# Patient Record
Sex: Female | Born: 1960 | Race: White | Hispanic: No | Marital: Married
Health system: Southern US, Community
[De-identification: ages and names within clinical notes are randomized; demographics above are authoritative.]

## PROBLEM LIST (undated history)

## (undated) HISTORY — PX: BREAST EXCISIONAL BIOPSY: SUR124

---

## 1998-08-08 ENCOUNTER — Ambulatory Visit (HOSPITAL_COMMUNITY): Admission: RE | Admit: 1998-08-08 | Discharge: 1998-08-08 | Payer: Self-pay | Admitting: Internal Medicine

## 1998-08-08 ENCOUNTER — Encounter: Payer: Self-pay | Admitting: Internal Medicine

## 1999-07-06 ENCOUNTER — Other Ambulatory Visit: Admission: RE | Admit: 1999-07-06 | Discharge: 1999-07-06 | Payer: Self-pay | Admitting: Obstetrics & Gynecology

## 1999-09-11 ENCOUNTER — Encounter: Payer: Self-pay | Admitting: Obstetrics & Gynecology

## 1999-09-11 ENCOUNTER — Encounter: Admission: RE | Admit: 1999-09-11 | Discharge: 1999-09-11 | Payer: Self-pay | Admitting: Obstetrics & Gynecology

## 2000-02-26 ENCOUNTER — Encounter: Admission: RE | Admit: 2000-02-26 | Discharge: 2000-02-26 | Payer: Self-pay | Admitting: Obstetrics & Gynecology

## 2000-02-26 ENCOUNTER — Encounter: Payer: Self-pay | Admitting: Obstetrics & Gynecology

## 2000-09-02 ENCOUNTER — Other Ambulatory Visit: Admission: RE | Admit: 2000-09-02 | Discharge: 2000-09-02 | Payer: Self-pay | Admitting: Obstetrics & Gynecology

## 2000-10-27 ENCOUNTER — Encounter (INDEPENDENT_AMBULATORY_CARE_PROVIDER_SITE_OTHER): Payer: Self-pay | Admitting: Specialist

## 2000-10-27 ENCOUNTER — Observation Stay (HOSPITAL_COMMUNITY): Admission: RE | Admit: 2000-10-27 | Discharge: 2000-10-28 | Payer: Self-pay | Admitting: Obstetrics & Gynecology

## 2001-07-03 ENCOUNTER — Encounter: Admission: RE | Admit: 2001-07-03 | Discharge: 2001-07-03 | Payer: Self-pay | Admitting: Diagnostic Radiology

## 2001-11-27 ENCOUNTER — Other Ambulatory Visit: Admission: RE | Admit: 2001-11-27 | Discharge: 2001-11-27 | Payer: Self-pay | Admitting: Obstetrics & Gynecology

## 2002-01-01 ENCOUNTER — Encounter: Admission: RE | Admit: 2002-01-01 | Discharge: 2002-01-01 | Payer: Self-pay | Admitting: Obstetrics & Gynecology

## 2002-01-01 ENCOUNTER — Encounter: Payer: Self-pay | Admitting: Obstetrics & Gynecology

## 2003-01-04 ENCOUNTER — Other Ambulatory Visit: Admission: RE | Admit: 2003-01-04 | Discharge: 2003-01-04 | Payer: Self-pay | Admitting: Obstetrics & Gynecology

## 2003-03-04 ENCOUNTER — Encounter: Admission: RE | Admit: 2003-03-04 | Discharge: 2003-03-04 | Payer: Self-pay | Admitting: Urology

## 2003-03-04 ENCOUNTER — Encounter: Payer: Self-pay | Admitting: Urology

## 2004-08-30 ENCOUNTER — Other Ambulatory Visit: Admission: RE | Admit: 2004-08-30 | Discharge: 2004-08-30 | Payer: Self-pay | Admitting: Obstetrics & Gynecology

## 2005-03-11 ENCOUNTER — Encounter: Admission: RE | Admit: 2005-03-11 | Discharge: 2005-03-11 | Payer: Self-pay | Admitting: Obstetrics & Gynecology

## 2005-10-23 ENCOUNTER — Other Ambulatory Visit: Admission: RE | Admit: 2005-10-23 | Discharge: 2005-10-23 | Payer: Self-pay | Admitting: Obstetrics & Gynecology

## 2006-06-02 ENCOUNTER — Encounter: Admission: RE | Admit: 2006-06-02 | Discharge: 2006-06-02 | Payer: Self-pay | Admitting: Obstetrics & Gynecology

## 2006-11-04 ENCOUNTER — Encounter: Admission: RE | Admit: 2006-11-04 | Discharge: 2006-11-04 | Payer: Self-pay | Admitting: Occupational Medicine

## 2007-07-03 ENCOUNTER — Encounter: Admission: RE | Admit: 2007-07-03 | Discharge: 2007-07-03 | Payer: Self-pay | Admitting: Obstetrics & Gynecology

## 2008-03-18 ENCOUNTER — Encounter: Admission: RE | Admit: 2008-03-18 | Discharge: 2008-03-18 | Payer: Self-pay | Admitting: Diagnostic Radiology

## 2008-03-21 ENCOUNTER — Encounter: Admission: RE | Admit: 2008-03-21 | Discharge: 2008-03-21 | Payer: Self-pay | Admitting: Sports Medicine

## 2008-03-28 ENCOUNTER — Encounter: Admission: RE | Admit: 2008-03-28 | Discharge: 2008-03-28 | Payer: Self-pay | Admitting: Sports Medicine

## 2008-07-04 ENCOUNTER — Encounter: Admission: RE | Admit: 2008-07-04 | Discharge: 2008-07-04 | Payer: Self-pay | Admitting: Obstetrics & Gynecology

## 2009-07-12 ENCOUNTER — Encounter: Admission: RE | Admit: 2009-07-12 | Discharge: 2009-07-12 | Payer: Self-pay | Admitting: Obstetrics & Gynecology

## 2009-07-29 IMAGING — CR DG KNEE 3 VIEWS*R*
3 series · 3 of 3 positions shown · non-contrast
Comparison: Ultrasound 03/18/2008

CLINICAL DATA: Pain and swelling posteriorly.  Question Baker's
cyst.

RIGHT KNEE - 3 VIEW

[w knee ap right]
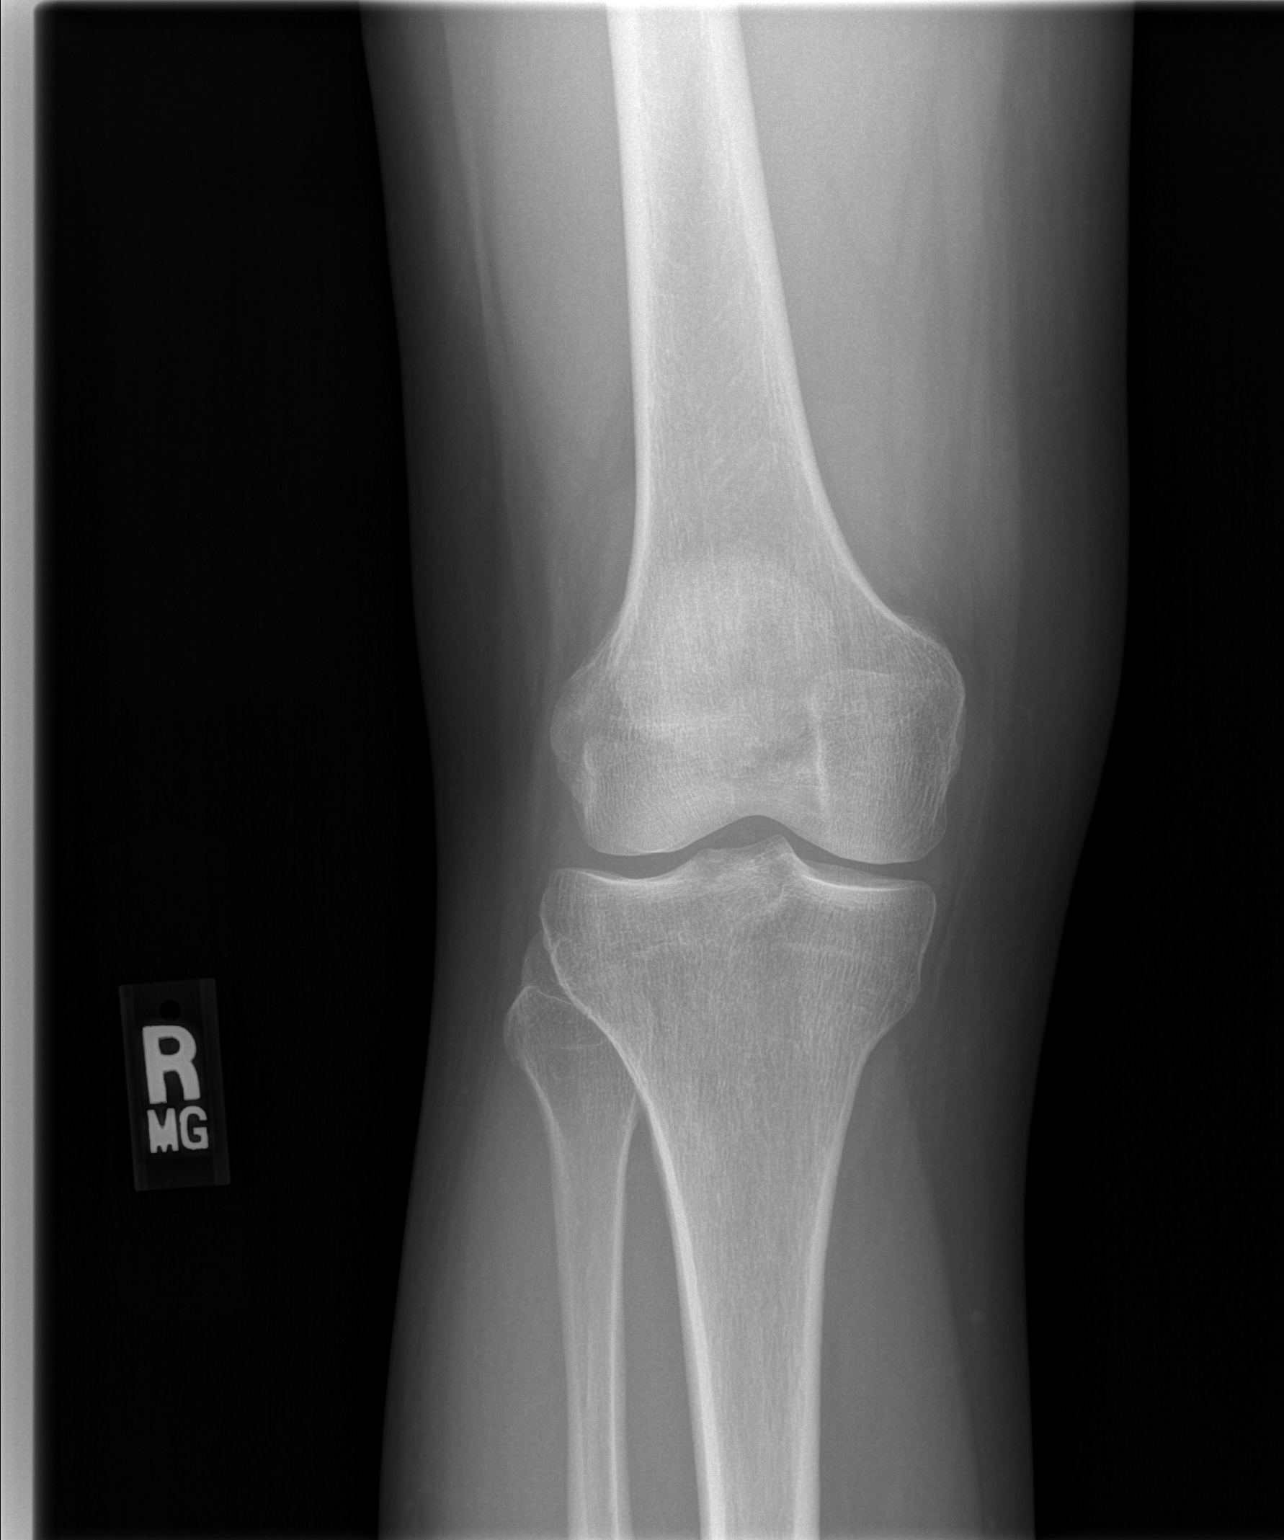

[w knee lat. right]
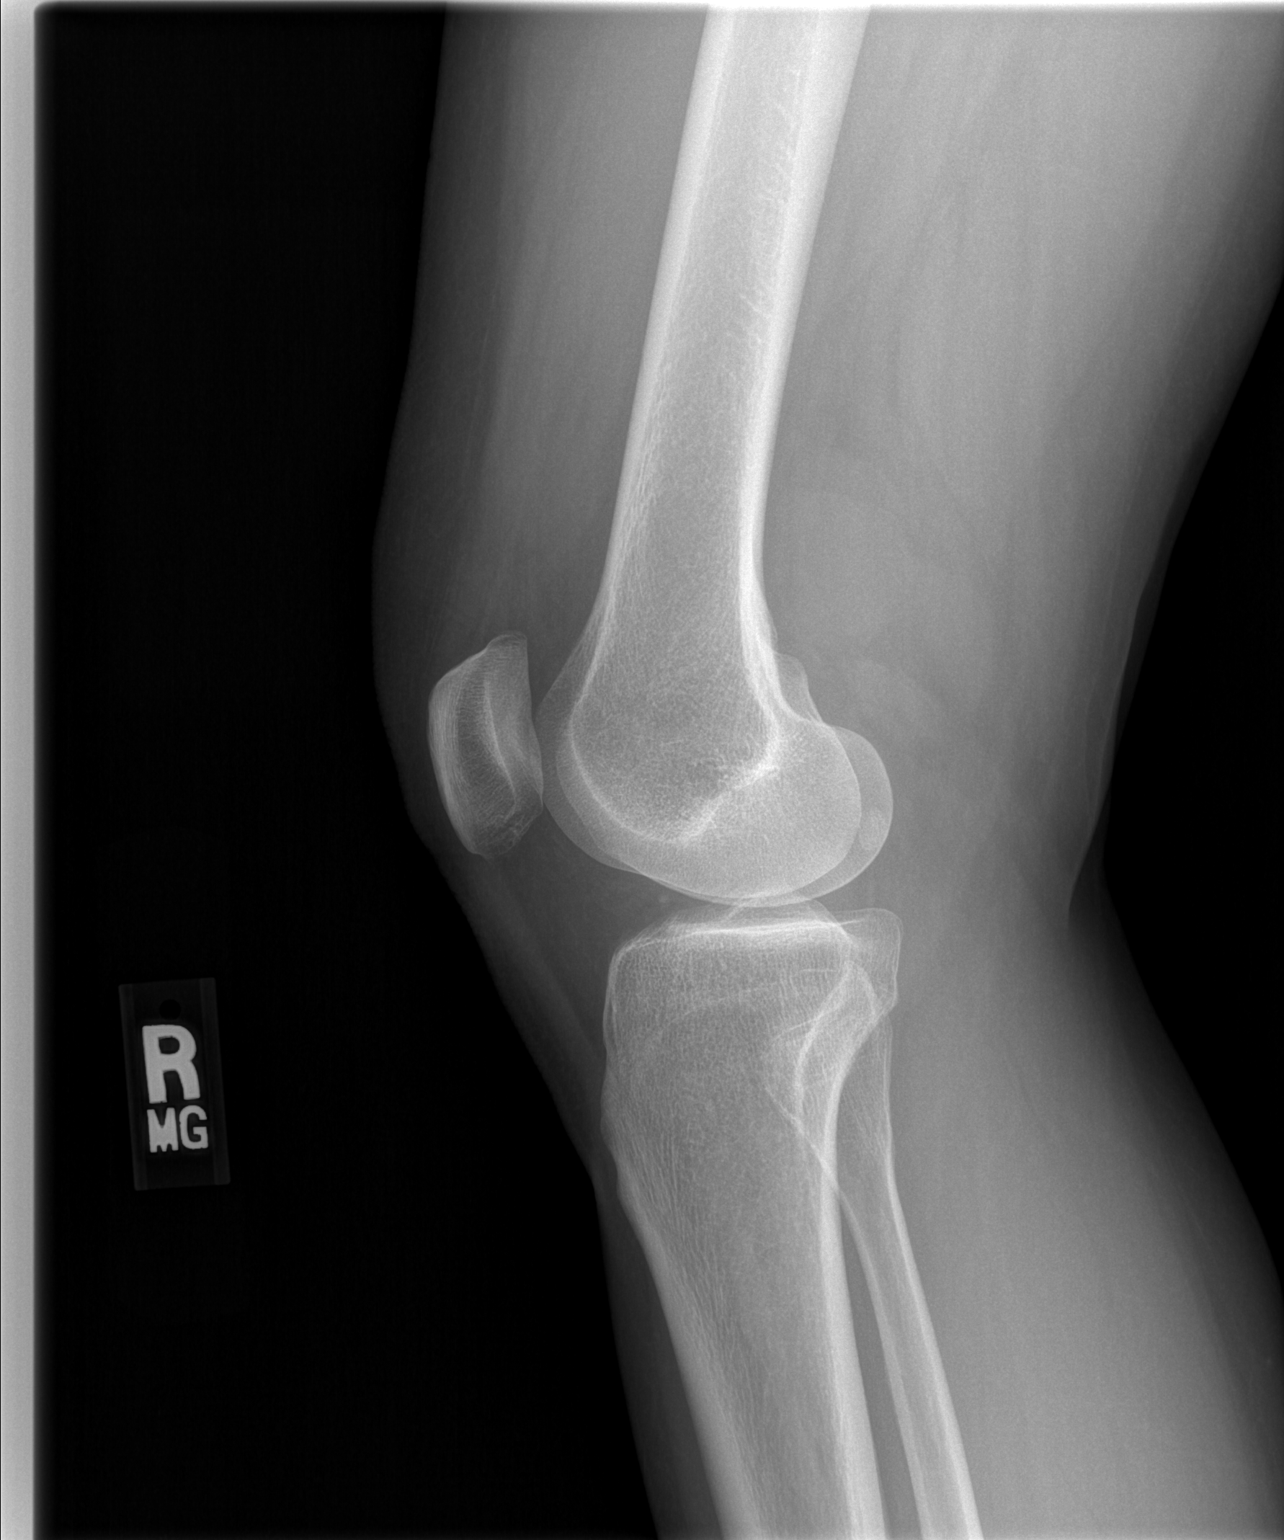

[t knee oblique right]
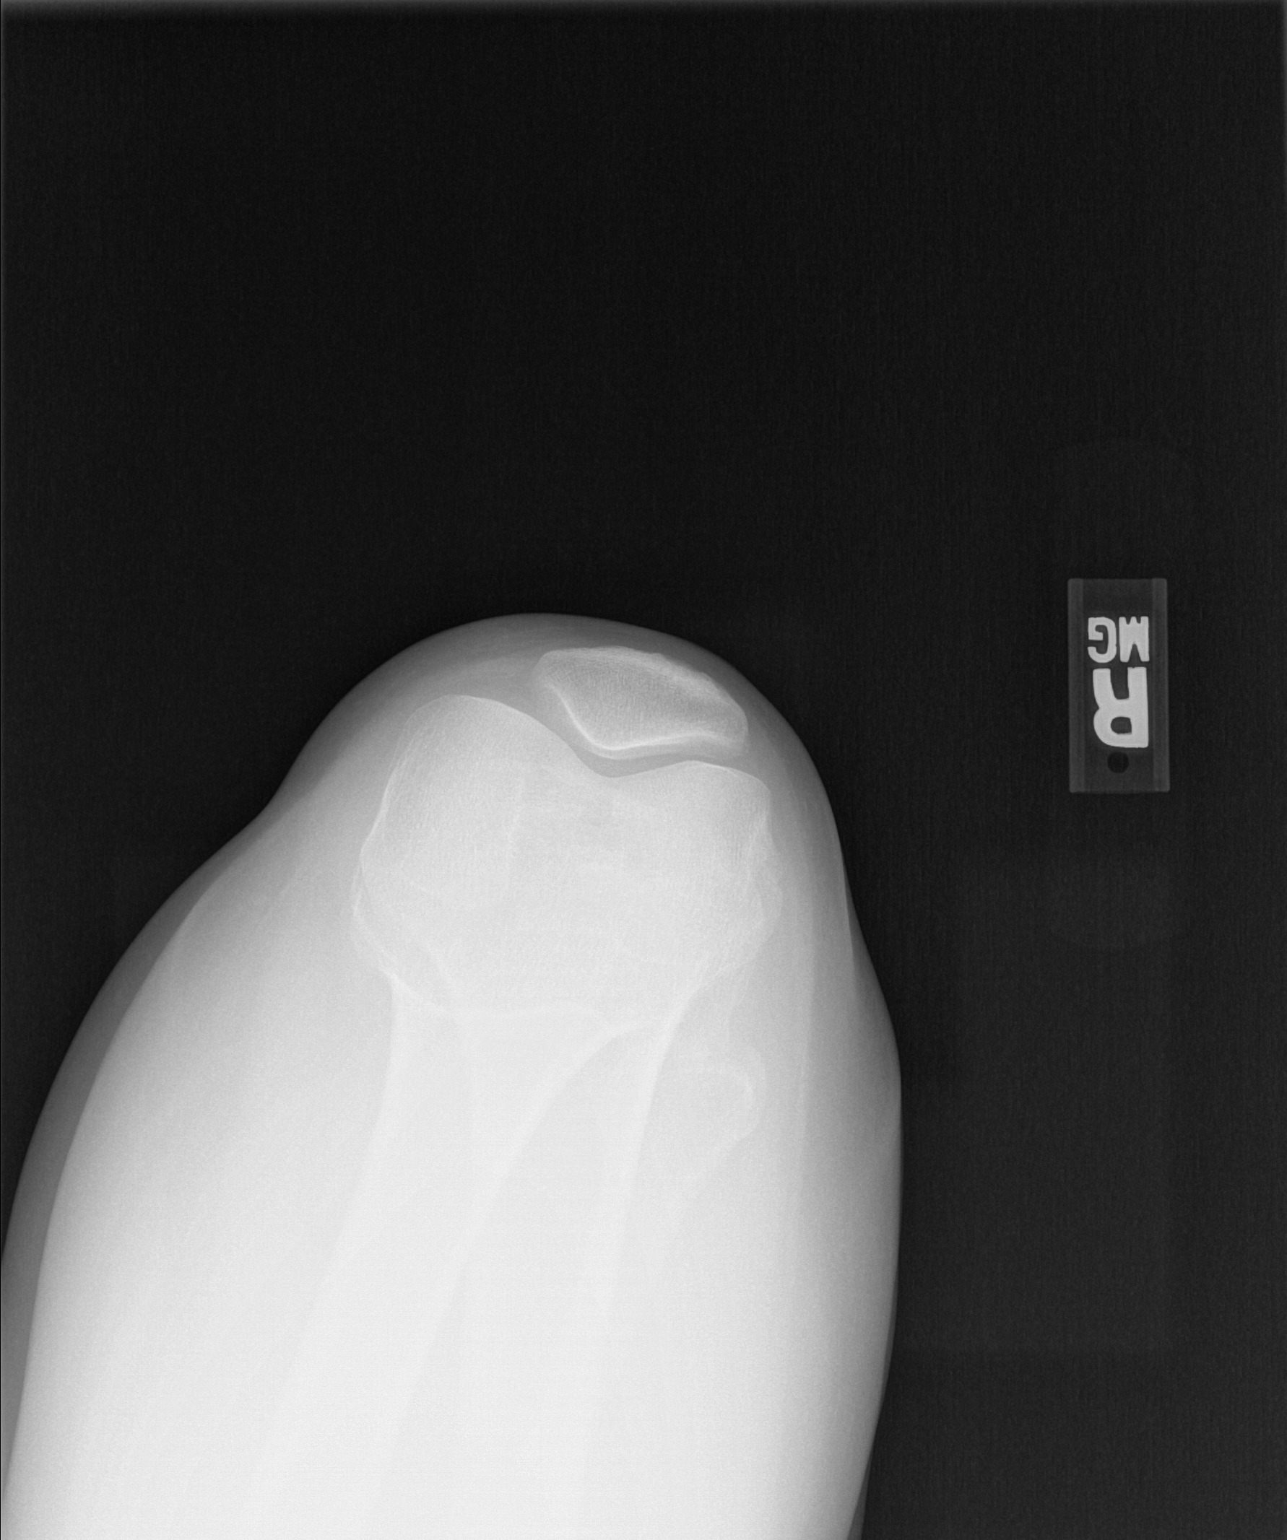

[3 of 3 positions shown; findings below may reference images not displayed]

FINDINGS: No joint effusion or fracture.  No significant
degenerative changes.
IMPRESSION: No acute findings.

## 2010-07-17 ENCOUNTER — Encounter: Admission: RE | Admit: 2010-07-17 | Discharge: 2010-07-17 | Payer: Self-pay | Admitting: Obstetrics & Gynecology

## 2010-09-16 ENCOUNTER — Encounter: Payer: Self-pay | Admitting: Diagnostic Radiology

## 2011-01-11 NOTE — Op Note (Signed)
[facility]  Patient:    Tracey Henderson, Tracey Henderson                      MRN: [id number] Proc. Date: 10/27/00 Adm. Date:  [id number] Attending:  Minette Headland                           Operative Report  PREOPERATIVE DIAGNOSIS:  Persistent dysfunctional bleeding unresponsive to conservative surgery and cyclic hormonal medications.  POSTOPERATIVE DIAGNOSIS:  Persistent dysfunctional bleeding unresponsive to conservative surgery and cyclic hormonal medications.  OPERATION:  Total vaginal hysterectomy.  SURGEON:  Freddy Finner, M.D.  ASSISTANT:  Trevor Iha, M.D.  ESTIMATED INTRAOPERATIVE BLOOD LOSS:  100 cc  INTRAOPERATIVE COMPLICATIONS:  None.  INDICATIONS:  The patients history and physical are outlined in present illness.  She was admitted on the morning of surgery.  She received a gram of Cefotan IV preoperatively.  She was placed in PAS hose.  She was brought to the operating room and there placed under adequate general endotracheal tube anesthesia, placed in dorsal lithotomy position using the [place] stirrup system.  A Betadine prep was carried out in the usual fashion with scrub followed by solution.  Please note that no latex products were used, whatsoever, during the course of this procedure.  Sterile drapes were applied.  Posterior weighted vaginal retractors were placed.  The cervix was grasped with a Christella Hartigan tenaculum.  Colpotomy incisions were made while tenting the mucosa posterior to the cervix with an Allis.  The cervix was circumscribed with a scalpel.  The ligature system was used to develop uterosacrals, bladder pillars, cardinal ligaments.  These were each sealed and divided on each side.  The anterior peritoneum was entered.  Vessel pedicles were taken with the sutureless system on each side.  The uterus was delivered through the vaginal ______.  Utero-ovarian pedicles were crossclamped, sealed with a sutureless ligature  system and divided sharply. Some bleeding on the right side was encountered near the uterosacral and this was controlled with a sutureless system.  The angles of the vagina were anchored to uterosacrals with mattress sutures of 0 monocryl.  Uterosacrals were plicated and the posterior peritoneum closed with an interrupted 0 monacryl suture.  There was no apparent abnormality of tubes or ovaries.  The ovaries did not descend well into the field because of the previous cesarean deliveries.  The cuff was closed roughly with figure-of-eight 0 monacryl.  A Foley catheter was placed.  The urine was blue consistent with intravenous indigo carmine given to ensure that no injury to the bladder occurred.  There was blue dye in the field whatsoever.  The patient tolerated the operative procedure well. All instruments were removed.  The estimated intraoperative blood loss was 100 cc.  She was taken to recovery in good condition. DD:  10/27/00 TD:  10/27/00 Job: [id number]

## 2011-01-11 NOTE — H&P (Signed)
[facility]  Patient:    Tracey Henderson, Tracey Henderson. Visit Number: [id number] MRN: [id number]          Service Type: Attending:  Freddy Finner, M.D. Adm. Date:  10/27/00                           History and Physical  ADMITTING DIAGNOSES:          1. Persistent dysfunction uterine bleeding.                               2. Previous surgical sterilization.                               3. Menorrhagia.                               4. Dysmenorrhea.  HISTORY OF PRESENT ILLNESS:   The patient is a 50 year old white married female, gravida 5, para 2, who has had persistent dysfunction uterine bleeding in spite of hysteroscopy D&C, cyclic progestin withdrawal, and oral contraceptives.  She has contraindication to prolonged oral contraceptive use because of a longstanding smoking habit.  She uses 1-1/2 packs of cigarettes per day.  She has requested definitive surgical intervention and is admitted at this time for a vaginal hysterectomy.  REVIEW OF SYSTEMS:            Her current review of systems is otherwise negative.  She has no significant GI, GU, or GYN complaints except for menorrhagia, dysmenorrhea, and persistent mid cycle dysfunctional bleeding.  PAST MEDICAL HISTORY:         The patient has no known significant medical illnesses.  She has no known allergies to medications.  Her current medications include Xanax which she takes on a p.r.n. basis.  She does episodically take Allegra D.  She has never had a blood transfusion.  PAST SURGICAL HISTORY:        Breast biopsy in 1983.  She has had two cesarean deliveries.  She had laparoscopic left salpingectomy for ectopic pregnancy. She had right tubal ligation at the time of her second cesarean birth in 44. She had the hysteroscopy D&C noted above in 1998.  FAMILY HISTORY:               Noncontributory.  PHYSICAL EXAMINATION:  HEENT:                        Grossly within normal limits.  VITAL SIGNS:                   Blood pressure in the office is 120/66.  Thyroid gland is not palpably enlarged.  CHEST:                        Clear to auscultation.  HEART:                        Normal sinus rhythm without murmur, rub or gallop.  ABDOMEN:                      Soft and nontender without appreciable organomegaly or palpable masses.  BREAST:  No palpable masses.  No skin changes.  No nipple discharge.  PELVIC:                       Clinically normal.  The external genitalia, vagina, and cervix are normal.  The uterus is not palpably enlarged.  On bimanual, there are no palpable adnexal masses.  The rectovaginal exam confirms these findings.  EXTREMITIES:                  Without cyanosis, clubbing, or edema.  ASSESSMENT:                   Persistent dysfunctional uterine bleeding unresponsive to conservative measures.  Contraindication to continuous oral contraceptive use, which incidentally did not produce an improvement in her bleeding after three months.  PLAN:                         Total vaginal hysterectomy.  The patient has reviewed a video in the office describing the procedure including the potential risks of the procedure.  She is admitted now for surgery. Attending:  Freddy Finner, M.D. DD:  10/26/00 TD:  10/26/00 Job: [id number]

## 2011-10-30 ENCOUNTER — Other Ambulatory Visit: Payer: Self-pay | Admitting: Obstetrics & Gynecology

## 2011-10-30 DIAGNOSIS — Z1231 Encounter for screening mammogram for malignant neoplasm of breast: Secondary | ICD-10-CM

## 2011-11-04 ENCOUNTER — Ambulatory Visit: Payer: Self-pay

## 2011-11-26 ENCOUNTER — Ambulatory Visit
Admission: RE | Admit: 2011-11-26 | Discharge: 2011-11-26 | Disposition: A | Discharge status: Home / Self Care | Payer: PRIVATE HEALTH INSURANCE | Source: Ambulatory Visit | Attending: Obstetrics & Gynecology | Admitting: Obstetrics & Gynecology

## 2011-11-26 DIAGNOSIS — Z1231 Encounter for screening mammogram for malignant neoplasm of breast: Secondary | ICD-10-CM

## 2011-11-28 ENCOUNTER — Ambulatory Visit
Admission: RE | Admit: 2011-11-28 | Discharge: 2011-11-28 | Disposition: A | Discharge status: Home / Self Care | Payer: PRIVATE HEALTH INSURANCE | Source: Ambulatory Visit | Attending: Obstetrics & Gynecology | Admitting: Obstetrics & Gynecology

## 2011-11-28 ENCOUNTER — Other Ambulatory Visit: Payer: Self-pay | Admitting: Obstetrics & Gynecology

## 2011-11-28 DIAGNOSIS — R928 Other abnormal and inconclusive findings on diagnostic imaging of breast: Secondary | ICD-10-CM

## 2011-12-02 ENCOUNTER — Other Ambulatory Visit: Payer: PRIVATE HEALTH INSURANCE

## 2011-12-27 ENCOUNTER — Other Ambulatory Visit: Payer: Self-pay | Admitting: Orthopedic Surgery

## 2011-12-27 ENCOUNTER — Ambulatory Visit
Admission: RE | Admit: 2011-12-27 | Discharge: 2011-12-27 | Disposition: A | Discharge status: Home / Self Care | Payer: PRIVATE HEALTH INSURANCE | Source: Ambulatory Visit | Attending: Orthopedic Surgery | Admitting: Orthopedic Surgery

## 2011-12-27 DIAGNOSIS — R52 Pain, unspecified: Secondary | ICD-10-CM

## 2011-12-27 DIAGNOSIS — M25561 Pain in right knee: Secondary | ICD-10-CM

## 2012-01-03 ENCOUNTER — Ambulatory Visit
Admission: RE | Admit: 2012-01-03 | Discharge: 2012-01-03 | Disposition: A | Discharge status: Home / Self Care | Payer: PRIVATE HEALTH INSURANCE | Source: Ambulatory Visit | Attending: Orthopedic Surgery | Admitting: Orthopedic Surgery

## 2012-01-03 DIAGNOSIS — M25561 Pain in right knee: Secondary | ICD-10-CM

## 2012-08-12 ENCOUNTER — Other Ambulatory Visit: Payer: Self-pay | Admitting: Obstetrics & Gynecology

## 2012-08-12 DIAGNOSIS — N6459 Other signs and symptoms in breast: Secondary | ICD-10-CM

## 2012-08-21 ENCOUNTER — Ambulatory Visit
Admission: RE | Admit: 2012-08-21 | Discharge: 2012-08-21 | Disposition: A | Discharge status: Home / Self Care | Payer: PRIVATE HEALTH INSURANCE | Source: Ambulatory Visit | Attending: Obstetrics & Gynecology | Admitting: Obstetrics & Gynecology

## 2012-08-21 ENCOUNTER — Other Ambulatory Visit: Payer: Self-pay | Admitting: Obstetrics & Gynecology

## 2012-08-21 DIAGNOSIS — N6459 Other signs and symptoms in breast: Secondary | ICD-10-CM

## 2012-11-17 ENCOUNTER — Other Ambulatory Visit: Payer: Self-pay

## 2012-11-17 DIAGNOSIS — Z1231 Encounter for screening mammogram for malignant neoplasm of breast: Secondary | ICD-10-CM

## 2012-12-10 ENCOUNTER — Ambulatory Visit
Admission: RE | Admit: 2012-12-10 | Discharge: 2012-12-10 | Disposition: A | Discharge status: Home / Self Care | Payer: PRIVATE HEALTH INSURANCE | Source: Ambulatory Visit

## 2012-12-10 DIAGNOSIS — Z1231 Encounter for screening mammogram for malignant neoplasm of breast: Secondary | ICD-10-CM

## 2013-04-06 IMAGING — US US BREAST*R*
1 series · 8 of 8 positions shown · non-contrast
Comparison: 11/26/2011 and earlier

CLINICAL DATA: The patient returns after screening study for
evaluation of the right breast.

DIGITAL DIAGNOSTIC RIGHT MAMMOGRAM WITH CAD AND RIGHT BREAST
ULTRASOUND:

[Series 1: us breast*right* · 8 of 8 slices shown]
[im 1/8]
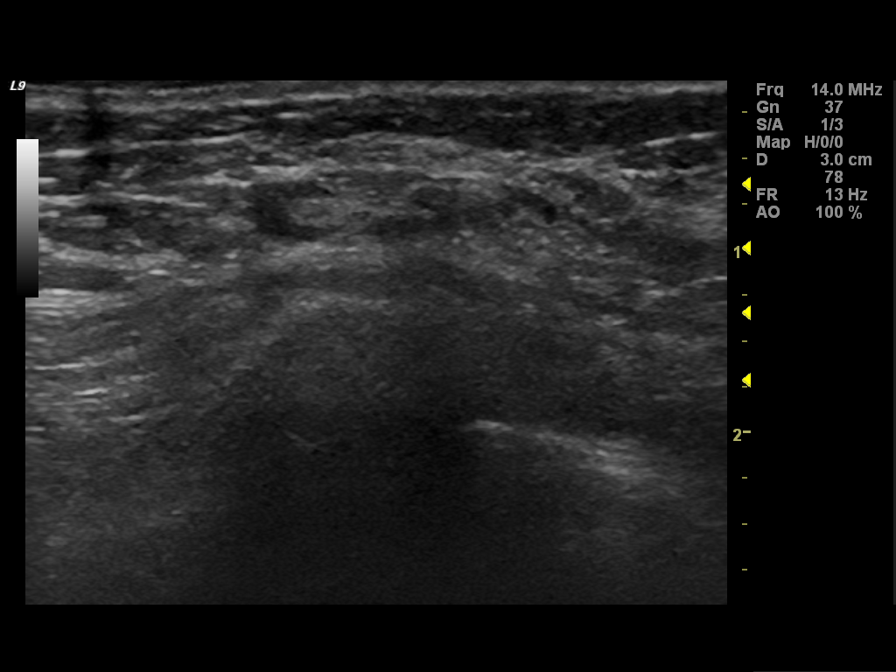
[im 2/8]
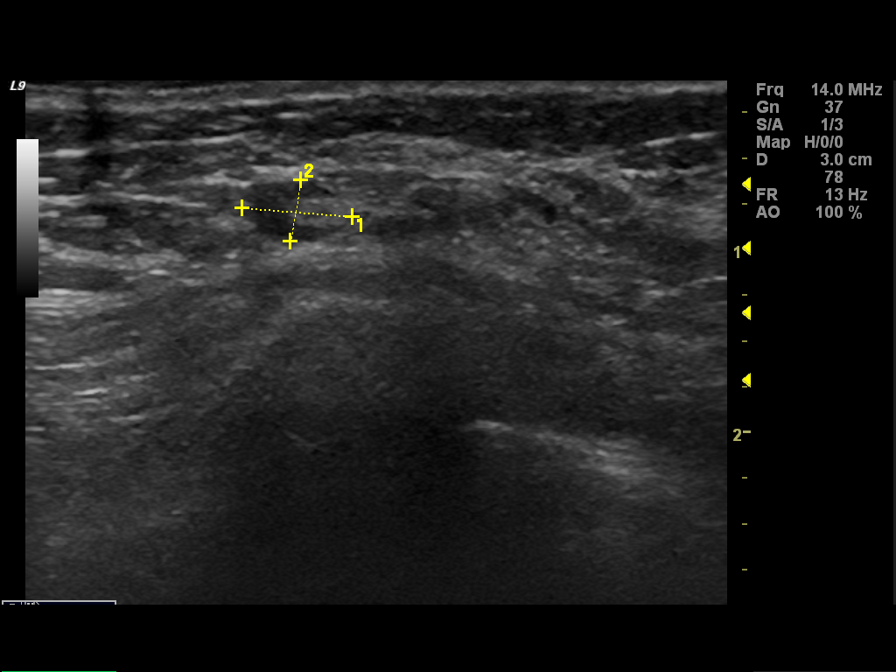
[im 3/8]
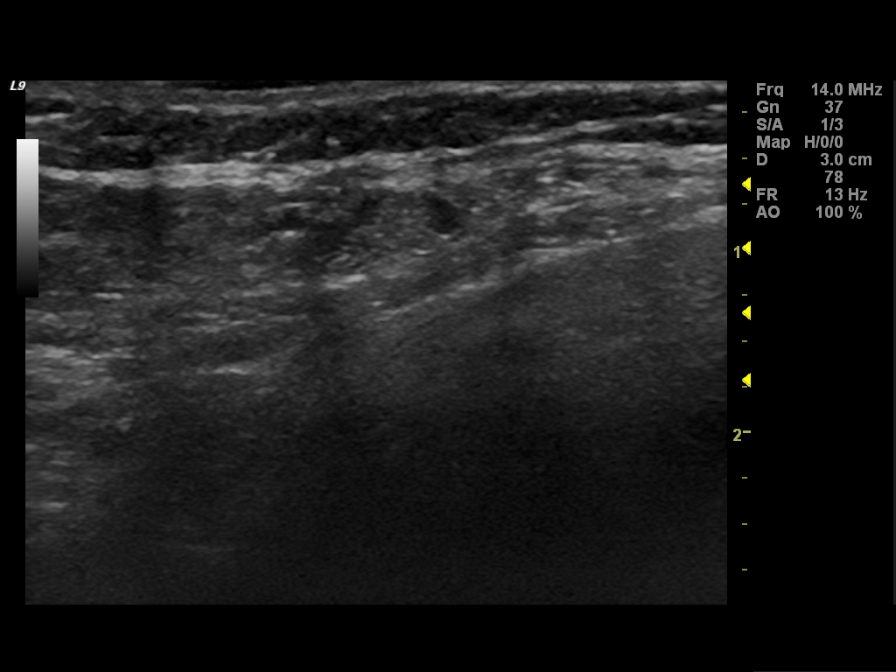
[im 4/8]
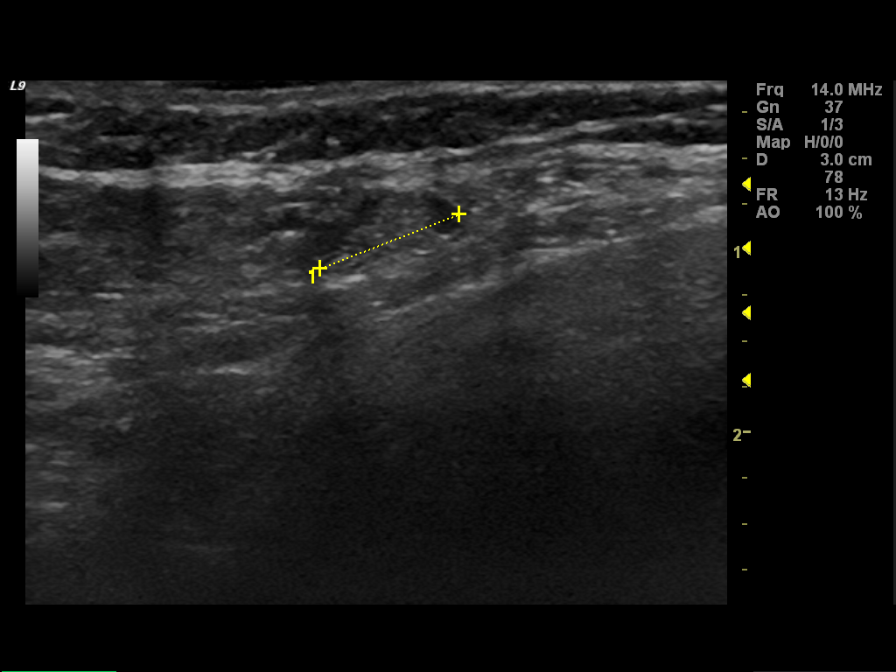
[im 5/8]
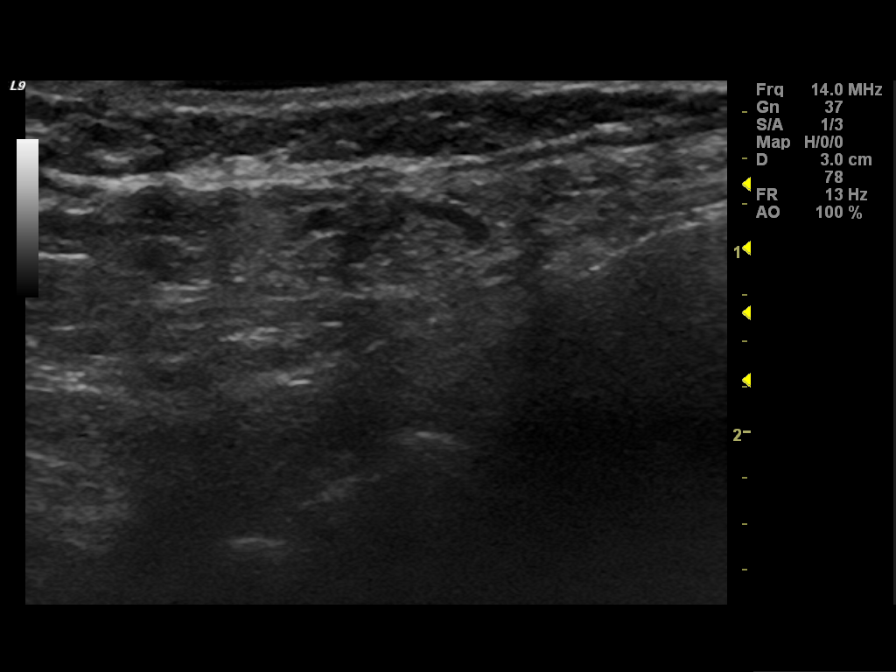
[im 6/8]
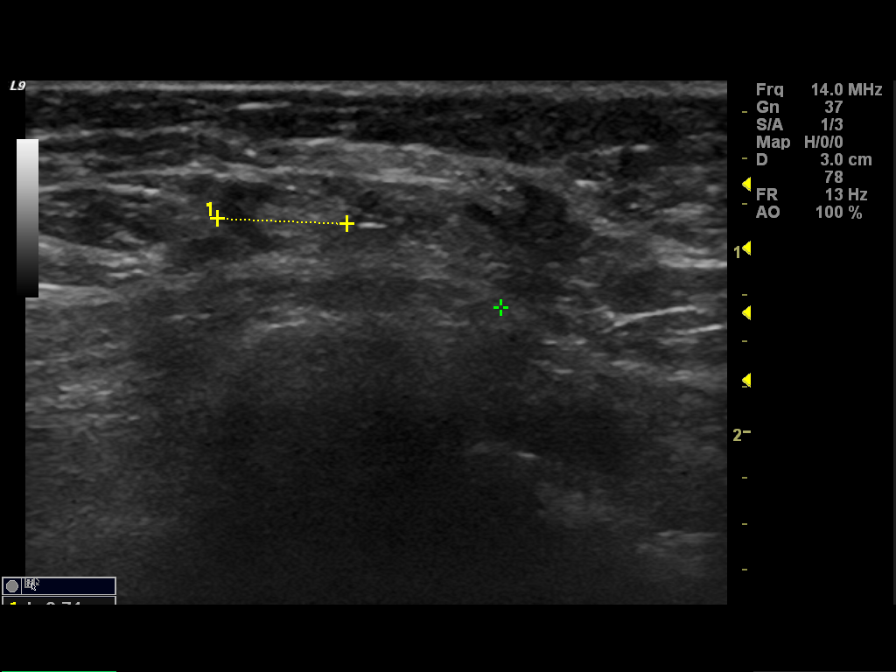
[im 7/8]
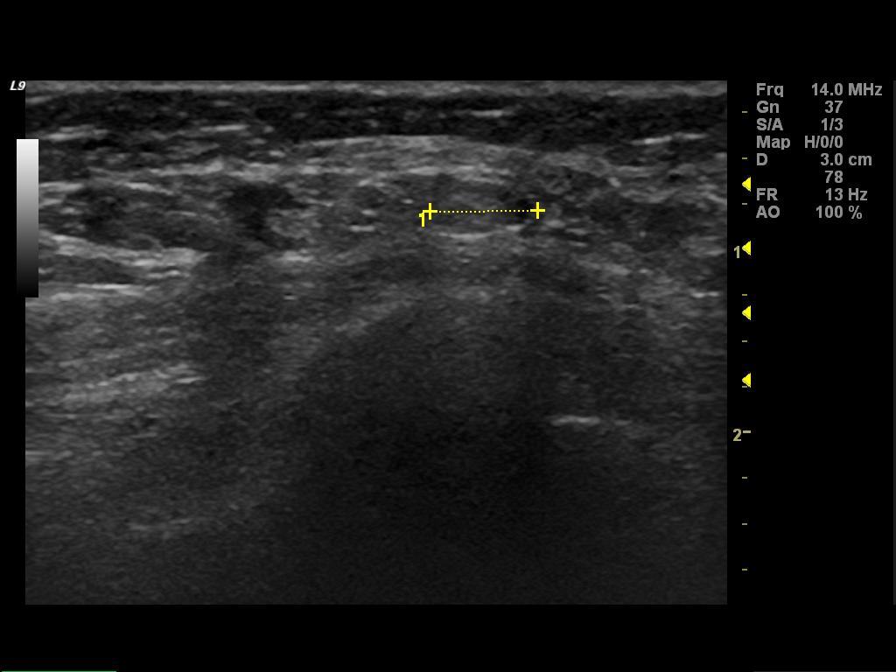
[im 8/8]
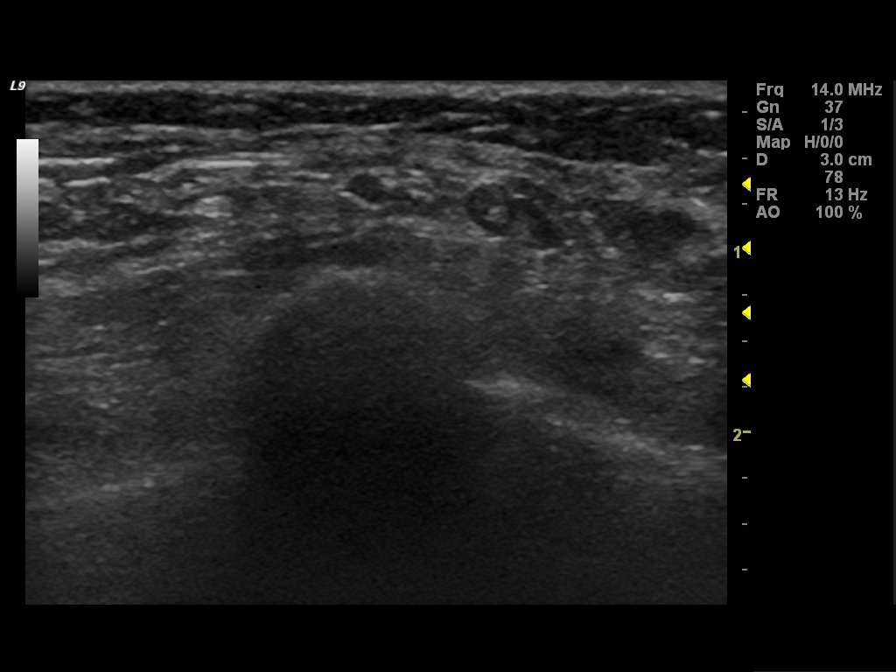

[8 of 8 positions shown; findings below may reference images not displayed]

FINDINGS: Additional views are performed, confirming presence of a
circumscribed nodule in the upper portion of the right breast, not
well seen on the craniocaudal view.
Mammographic images were processed with CAD.

On physical exam, I palpate soft nodularity in the upper-outer
quadrant of the right breast.  No discrete masses palpable.

Ultrasound is performed, showing two adjacent intramammary lymph
nodes in the upper outer quadrant of the right breast, 10 o'clock
location 12 cm from the nipple.  These measure approximately 7 mm
in diameter.  No suspicious mass identified in the upper-outer
quadrant or lower axilla.
IMPRESSION: 1.  Persistent abnormality represents an intramammary lymph node by
ultrasound.
2. No mammographic or ultrasound evidence for malignancy.
3. Screening mammogram is recommended in one year.

BI-RADS CATEGORY 2:  Benign finding(s).

## 2013-11-04 ENCOUNTER — Other Ambulatory Visit: Payer: Self-pay

## 2013-11-04 DIAGNOSIS — Z1231 Encounter for screening mammogram for malignant neoplasm of breast: Secondary | ICD-10-CM

## 2013-12-21 ENCOUNTER — Ambulatory Visit: Payer: PRIVATE HEALTH INSURANCE

## 2013-12-28 ENCOUNTER — Encounter (INDEPENDENT_AMBULATORY_CARE_PROVIDER_SITE_OTHER): Payer: Self-pay

## 2013-12-28 ENCOUNTER — Ambulatory Visit
Admission: RE | Admit: 2013-12-28 | Discharge: 2013-12-28 | Disposition: A | Discharge status: Home / Self Care | Payer: PRIVATE HEALTH INSURANCE | Source: Ambulatory Visit

## 2013-12-28 DIAGNOSIS — Z1231 Encounter for screening mammogram for malignant neoplasm of breast: Secondary | ICD-10-CM

## 2015-05-29 ENCOUNTER — Other Ambulatory Visit: Payer: Self-pay | Admitting: Internal Medicine

## 2015-05-29 ENCOUNTER — Ambulatory Visit
Admission: RE | Admit: 2015-05-29 | Discharge: 2015-05-29 | Disposition: A | Discharge status: Home / Self Care | Payer: PRIVATE HEALTH INSURANCE | Source: Ambulatory Visit | Attending: Internal Medicine | Admitting: Internal Medicine

## 2015-05-29 DIAGNOSIS — M5489 Other dorsalgia: Secondary | ICD-10-CM

## 2015-10-17 ENCOUNTER — Other Ambulatory Visit: Payer: Self-pay

## 2015-10-17 DIAGNOSIS — Z1231 Encounter for screening mammogram for malignant neoplasm of breast: Secondary | ICD-10-CM

## 2015-11-16 ENCOUNTER — Ambulatory Visit: Payer: PRIVATE HEALTH INSURANCE

## 2015-11-16 ENCOUNTER — Ambulatory Visit
Admission: RE | Admit: 2015-11-16 | Discharge: 2015-11-16 | Disposition: A | Discharge status: Home / Self Care | Payer: PRIVATE HEALTH INSURANCE | Source: Ambulatory Visit

## 2015-11-16 DIAGNOSIS — Z1231 Encounter for screening mammogram for malignant neoplasm of breast: Secondary | ICD-10-CM

## 2016-11-11 ENCOUNTER — Other Ambulatory Visit: Payer: Self-pay | Admitting: Obstetrics & Gynecology

## 2016-11-11 DIAGNOSIS — Z1231 Encounter for screening mammogram for malignant neoplasm of breast: Secondary | ICD-10-CM

## 2016-11-13 ENCOUNTER — Ambulatory Visit
Admission: RE | Admit: 2016-11-13 | Discharge: 2016-11-13 | Disposition: A | Discharge status: Home / Self Care | Payer: PRIVATE HEALTH INSURANCE | Source: Ambulatory Visit | Attending: Obstetrics & Gynecology | Admitting: Obstetrics & Gynecology

## 2016-11-13 DIAGNOSIS — Z1231 Encounter for screening mammogram for malignant neoplasm of breast: Secondary | ICD-10-CM

## 2017-10-07 ENCOUNTER — Other Ambulatory Visit: Payer: Self-pay | Admitting: Obstetrics & Gynecology

## 2017-10-07 DIAGNOSIS — Z1231 Encounter for screening mammogram for malignant neoplasm of breast: Secondary | ICD-10-CM

## 2017-10-07 DIAGNOSIS — Z1239 Encounter for other screening for malignant neoplasm of breast: Secondary | ICD-10-CM

## 2017-11-13 ENCOUNTER — Ambulatory Visit
Admission: RE | Admit: 2017-11-13 | Discharge: 2017-11-13 | Disposition: A | Discharge status: Home / Self Care | Payer: PRIVATE HEALTH INSURANCE | Source: Ambulatory Visit | Attending: Obstetrics & Gynecology | Admitting: Obstetrics & Gynecology

## 2017-11-13 DIAGNOSIS — Z1231 Encounter for screening mammogram for malignant neoplasm of breast: Secondary | ICD-10-CM

## 2018-10-12 ENCOUNTER — Other Ambulatory Visit: Payer: Self-pay | Admitting: Obstetrics & Gynecology

## 2018-10-12 DIAGNOSIS — Z1231 Encounter for screening mammogram for malignant neoplasm of breast: Secondary | ICD-10-CM

## 2018-11-11 ENCOUNTER — Ambulatory Visit: Payer: PRIVATE HEALTH INSURANCE

## 2019-10-12 ENCOUNTER — Ambulatory Visit
Admission: RE | Admit: 2019-10-12 | Discharge: 2019-10-12 | Disposition: A | Discharge status: Home / Self Care | Payer: Managed Care, Other (non HMO) | Source: Ambulatory Visit | Attending: Obstetrics & Gynecology | Admitting: Obstetrics & Gynecology

## 2019-10-12 ENCOUNTER — Other Ambulatory Visit: Payer: Self-pay

## 2019-10-12 DIAGNOSIS — Z1231 Encounter for screening mammogram for malignant neoplasm of breast: Secondary | ICD-10-CM

## 2019-11-22 ENCOUNTER — Ambulatory Visit
Admission: RE | Admit: 2019-11-22 | Discharge: 2019-11-22 | Disposition: A | Discharge status: Home / Self Care | Payer: Managed Care, Other (non HMO) | Source: Ambulatory Visit | Attending: Internal Medicine | Admitting: Internal Medicine

## 2019-11-22 ENCOUNTER — Other Ambulatory Visit: Payer: Self-pay | Admitting: Internal Medicine

## 2019-11-22 DIAGNOSIS — R109 Unspecified abdominal pain: Secondary | ICD-10-CM

## 2019-11-22 DIAGNOSIS — R319 Hematuria, unspecified: Secondary | ICD-10-CM

## 2020-05-31 ENCOUNTER — Other Ambulatory Visit (HOSPITAL_COMMUNITY): Payer: Self-pay | Admitting: Internal Medicine

## 2020-05-31 ENCOUNTER — Ambulatory Visit: Payer: Managed Care, Other (non HMO) | Attending: Internal Medicine

## 2020-05-31 DIAGNOSIS — Z23 Encounter for immunization: Secondary | ICD-10-CM

## 2020-05-31 NOTE — Progress Notes (Signed)
   Covid-[facility] Vaccination [facility]  Name:  Tracey Henderson    MRN: [id number] DOB: 10-21-1960  05/31/2020  Tracey Henderson was observed post Covid-19 immunization for 15 minutes without incident. She was provided with Vaccine Information Sheet and instruction to access the V-Safe system.   Tracey Henderson was instructed to call 911 with any severe reactions post vaccine: Marland Kitchen Difficulty breathing  . Swelling of face and throat  . A fast heartbeat  . A bad rash all over body  . Dizziness and weakness

## 2020-10-02 ENCOUNTER — Other Ambulatory Visit: Payer: Self-pay | Admitting: Obstetrics & Gynecology

## 2020-10-02 DIAGNOSIS — Z1231 Encounter for screening mammogram for malignant neoplasm of breast: Secondary | ICD-10-CM

## 2020-11-27 ENCOUNTER — Ambulatory Visit
Admission: RE | Admit: 2020-11-27 | Discharge: 2020-11-27 | Disposition: A | Discharge status: Home / Self Care | Payer: Managed Care, Other (non HMO) | Source: Ambulatory Visit | Attending: Obstetrics & Gynecology | Admitting: Obstetrics & Gynecology

## 2020-11-27 ENCOUNTER — Other Ambulatory Visit: Payer: Self-pay

## 2020-11-27 DIAGNOSIS — Z1231 Encounter for screening mammogram for malignant neoplasm of breast: Secondary | ICD-10-CM

## 2020-11-29 ENCOUNTER — Ambulatory Visit
Admission: RE | Admit: 2020-11-29 | Discharge: 2020-11-29 | Disposition: A | Discharge status: Home / Self Care | Payer: Managed Care, Other (non HMO) | Source: Ambulatory Visit | Attending: Obstetrics & Gynecology | Admitting: Obstetrics & Gynecology

## 2020-11-29 ENCOUNTER — Other Ambulatory Visit: Payer: Self-pay | Admitting: Obstetrics & Gynecology

## 2020-11-29 ENCOUNTER — Other Ambulatory Visit: Payer: Self-pay

## 2020-11-29 DIAGNOSIS — R928 Other abnormal and inconclusive findings on diagnostic imaging of breast: Secondary | ICD-10-CM

## 2021-11-05 ENCOUNTER — Other Ambulatory Visit: Payer: Self-pay | Admitting: Obstetrics & Gynecology

## 2021-11-05 DIAGNOSIS — Z1231 Encounter for screening mammogram for malignant neoplasm of breast: Secondary | ICD-10-CM

## 2021-11-16 ENCOUNTER — Ambulatory Visit
Admission: RE | Admit: 2021-11-16 | Discharge: 2021-11-16 | Disposition: A | Discharge status: Home / Self Care | Payer: Managed Care, Other (non HMO) | Source: Ambulatory Visit | Attending: Obstetrics & Gynecology | Admitting: Obstetrics & Gynecology

## 2021-11-16 DIAGNOSIS — Z1231 Encounter for screening mammogram for malignant neoplasm of breast: Secondary | ICD-10-CM

## 2021-11-29 ENCOUNTER — Other Ambulatory Visit: Payer: Self-pay | Admitting: Internal Medicine

## 2021-11-29 DIAGNOSIS — Z1231 Encounter for screening mammogram for malignant neoplasm of breast: Secondary | ICD-10-CM

## 2021-11-30 ENCOUNTER — Ambulatory Visit: Payer: Managed Care, Other (non HMO)

## 2021-12-05 ENCOUNTER — Ambulatory Visit
Admission: RE | Admit: 2021-12-05 | Discharge: 2021-12-05 | Disposition: A | Discharge status: Home / Self Care | Payer: Managed Care, Other (non HMO) | Source: Ambulatory Visit | Attending: Internal Medicine | Admitting: Internal Medicine

## 2021-12-05 ENCOUNTER — Other Ambulatory Visit: Payer: Self-pay | Admitting: Obstetrics & Gynecology

## 2021-12-05 DIAGNOSIS — Z1231 Encounter for screening mammogram for malignant neoplasm of breast: Secondary | ICD-10-CM

## 2021-12-10 ENCOUNTER — Other Ambulatory Visit: Payer: Self-pay | Admitting: Obstetrics & Gynecology

## 2021-12-10 DIAGNOSIS — R928 Other abnormal and inconclusive findings on diagnostic imaging of breast: Secondary | ICD-10-CM

## 2021-12-14 ENCOUNTER — Ambulatory Visit: Payer: Managed Care, Other (non HMO)

## 2021-12-14 ENCOUNTER — Ambulatory Visit
Admission: RE | Admit: 2021-12-14 | Discharge: 2021-12-14 | Disposition: A | Discharge status: Home / Self Care | Payer: Managed Care, Other (non HMO) | Source: Ambulatory Visit | Attending: Obstetrics & Gynecology | Admitting: Obstetrics & Gynecology

## 2021-12-14 DIAGNOSIS — R928 Other abnormal and inconclusive findings on diagnostic imaging of breast: Secondary | ICD-10-CM

## 2021-12-19 ENCOUNTER — Other Ambulatory Visit: Payer: Managed Care, Other (non HMO)

## 2022-12-27 ENCOUNTER — Other Ambulatory Visit: Payer: Self-pay | Admitting: Obstetrics and Gynecology

## 2022-12-27 ENCOUNTER — Ambulatory Visit
Admission: RE | Admit: 2022-12-27 | Discharge: 2022-12-27 | Disposition: A | Discharge status: Home / Self Care | Payer: Managed Care, Other (non HMO) | Source: Ambulatory Visit | Attending: Obstetrics and Gynecology | Admitting: Obstetrics and Gynecology

## 2022-12-27 DIAGNOSIS — Z1231 Encounter for screening mammogram for malignant neoplasm of breast: Secondary | ICD-10-CM

## 2023-12-31 ENCOUNTER — Other Ambulatory Visit: Payer: Self-pay | Admitting: Internal Medicine

## 2023-12-31 DIAGNOSIS — Z1231 Encounter for screening mammogram for malignant neoplasm of breast: Secondary | ICD-10-CM

## 2024-01-02 ENCOUNTER — Ambulatory Visit
Admission: RE | Admit: 2024-01-02 | Discharge: 2024-01-02 | Disposition: A | Discharge status: Home / Self Care | Source: Ambulatory Visit | Attending: Internal Medicine

## 2024-01-02 DIAGNOSIS — Z1231 Encounter for screening mammogram for malignant neoplasm of breast: Secondary | ICD-10-CM
